# Patient Record
Sex: Male | Born: 1995 | Race: White | Hispanic: No | Marital: Single | Smoking: Current every day smoker
Health system: Southern US, Community
[De-identification: ages and names within clinical notes are randomized; demographics above are authoritative.]

## PROBLEM LIST (undated history)

## (undated) DIAGNOSIS — E079 Disorder of thyroid, unspecified: Secondary | ICD-10-CM

---
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
## 2017-02-20 DIAGNOSIS — Z720 Tobacco use: Secondary | ICD-10-CM | POA: Diagnosis not present

## 2017-02-20 DIAGNOSIS — Z0000 Encounter for general adult medical examination without abnormal findings: Secondary | ICD-10-CM | POA: Diagnosis not present

## 2017-09-18 DIAGNOSIS — A084 Viral intestinal infection, unspecified: Secondary | ICD-10-CM | POA: Diagnosis not present

## 2017-09-20 DIAGNOSIS — R1031 Right lower quadrant pain: Secondary | ICD-10-CM | POA: Diagnosis not present

## 2019-01-05 ENCOUNTER — Emergency Department
Admission: EM | Admit: 2019-01-05 | Discharge: 2019-01-05 | Disposition: A | Discharge status: Home / Self Care | Payer: Self-pay | Attending: Student in an Organized Health Care Education/Training Program | Admitting: Student in an Organized Health Care Education/Training Program

## 2019-01-05 ENCOUNTER — Encounter: Payer: Self-pay | Admitting: *Deleted

## 2019-01-05 ENCOUNTER — Other Ambulatory Visit: Payer: Self-pay

## 2019-01-05 DIAGNOSIS — S51811A Laceration without foreign body of right forearm, initial encounter: Secondary | ICD-10-CM | POA: Insufficient documentation

## 2019-01-05 DIAGNOSIS — Y939 Activity, unspecified: Secondary | ICD-10-CM | POA: Insufficient documentation

## 2019-01-05 DIAGNOSIS — Y929 Unspecified place or not applicable: Secondary | ICD-10-CM | POA: Insufficient documentation

## 2019-01-05 DIAGNOSIS — S41111A Laceration without foreign body of right upper arm, initial encounter: Secondary | ICD-10-CM

## 2019-01-05 DIAGNOSIS — S60511A Abrasion of right hand, initial encounter: Secondary | ICD-10-CM | POA: Insufficient documentation

## 2019-01-05 DIAGNOSIS — F17200 Nicotine dependence, unspecified, uncomplicated: Secondary | ICD-10-CM | POA: Insufficient documentation

## 2019-01-05 DIAGNOSIS — W25XXXA Contact with sharp glass, initial encounter: Secondary | ICD-10-CM | POA: Insufficient documentation

## 2019-01-05 DIAGNOSIS — Y999 Unspecified external cause status: Secondary | ICD-10-CM | POA: Insufficient documentation
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
## 2019-01-05 NOTE — ED Notes (Signed)
After being placed in  Room  Washed off arms  States he was fine did not want to be seen  Provider aware

## 2019-01-05 NOTE — ED Provider Notes (Signed)
[facility] Emergency Department Provider Note  ____________________________________________  Time seen: Approximately 7:06 PM  I have reviewed the triage vital signs and the nursing notes.   HISTORY  Chief Complaint Laceration    HPI Ginger Organoah Blower is a 23 y.o. male that presented to the emergency department to be evaluated for forearm lacerations from glass.  Patient was in the room and washing off lacerations and decided he did not want to be evaluated for lacerations any longer since they were smaller than he initially thought.  Last tetanus shot was within the last year.   No past medical history on file.  There are no active problems to display for this patient.   Prior to Admission medications   Not on File    Allergies Patient has no known allergies.  No family history on file.  Social History Social History   Tobacco Use  . Smoking status: Current Every Day Smoker  . Smokeless tobacco: Never Used  Substance Use Topics  . Alcohol use: Not Currently  . Drug use: Never     Review of Systems  Musculoskeletal: Negative for musculoskeletal pain. Skin: Negative for rash, ecchymosis.  Positive for lacerations.   ____________________________________________   PHYSICAL EXAM:  VITAL SIGNS: ED Triage Vitals  Enc Vitals Group     BP 01/05/19 1841 (!) 130/59     Pulse Rate 01/05/19 1841 79     Resp 01/05/19 1841 18     Temp 01/05/19 1841 99.5 F (37.5 C)     Temp Source 01/05/19 1841 Oral     SpO2 01/05/19 1841 99 %     Weight --      Height --      Head Circumference --      Peak Flow --      Pain Score 01/05/19 1839 0     Pain Loc --      Pain Edu? --      Excl. in GC? --      Constitutional: Alert and oriented. Well appearing and in no acute distress. Eyes: Conjunctivae are normal. PERRL. EOMI. Head: Atraumatic. ENT:      Ears:      Nose:       Mouth/Throat: Mucous membranes are moist.  Neck: No stridor.   Cardiovascular: Good peripheral circulation. Respiratory: Normal respiratory effort without tachypnea or retractions. Musculoskeletal: Full range of motion to all extremities. No gross deformities appreciated. Neurologic:  Normal speech and language. No gross focal neurologic deficits are appreciated.  Skin:  Skin is warm, dry and intact. No rash noted. Psychiatric: Mood and affect are normal. Speech and behavior are normal. Patient exhibits appropriate insight and judgement.   ____________________________________________   LABS (all labs ordered are listed, but only abnormal results are displayed)  Labs Reviewed - No data to display ____________________________________________  EKG   ____________________________________________  RADIOLOGY  No results found.  ____________________________________________    PROCEDURES  Procedure(s) performed:    Procedures    Medications - No data to display   ____________________________________________   INITIAL IMPRESSION / ASSESSMENT AND PLAN / ED COURSE  Pertinent labs & imaging results that were available during my care of the patient were reviewed by me and considered in my medical decision making (see chart for details).   Patient presents emergency department to be evaluated for lacerations.  Patient decided after being placed in ED room that lacerations were not as bad as he initially thought and did not want to be evaluated  for them anymore.  I viewed lacerations prior to patient leaving room.  He has a small laceration to forearm and an abrasion to his hand.  He will apply Neosporin and keep laceration covered.  Tetanus shot is up-to-date.    Halford Goetzke was evaluated in Emergency Department on 01/05/2019 for the symptoms described in the history of present illness. He was evaluated in the context of the global COVID-19 pandemic, which necessitated consideration that the patient might be at risk for infection with the  SARS-CoV-2 virus that causes COVID-19. Institutional protocols and algorithms that pertain to the evaluation of patients at risk for COVID-19 are in a state of rapid change based on information released by regulatory bodies including the CDC and federal and state organizations. These policies and algorithms were followed during the patient's care in the ED.  ____________________________________________  FINAL CLINICAL IMPRESSION(S) / ED DIAGNOSES  Final diagnoses:  Laceration of right upper extremity, initial encounter      NEW MEDICATIONS STARTED DURING THIS VISIT:  ED Discharge Orders    None          This chart was dictated using voice recognition software/Dragon. Despite best efforts to proofread, errors can occur which can change the meaning. Any change was purely unintentional.    Laban Emperor, PA-C 01/05/19 1913    Delman Kitten, MD 01/05/19 2125

## 2019-01-21 ENCOUNTER — Ambulatory Visit: Payer: Self-pay | Admitting: Licensed Clinical Social Worker

## 2021-01-26 ENCOUNTER — Emergency Department: Admission: EM | Admit: 2021-01-26 | Discharge: 2021-01-26 | Discharge status: Against Medical Advice | Payer: Self-pay

## 2021-01-26 ENCOUNTER — Ambulatory Visit: Payer: Self-pay

## 2021-01-26 NOTE — ED Notes (Signed)
Pt called in waiting area with no answer.

## 2021-02-01 NOTE — Progress Notes (Signed)
 Subjective:  Patient ID: Andre Strong is a 25 y.o. male.  HPI  Exposure To Illness Exposure to illness:  COVID 19 Is patient symptomatic?: Yes   Presenting/Associated symptoms: rhinorrhea and congestion   Severity:  Mild Onset quality:  Gradual Duration of current symptoms:  1 week Progression:  Gradually improving Chronicity:  New Relieved by:  OTC analgesics and OTC cough/cold medications Risk factors: known exposure to illness or sick contacts    Patient has not tested positive for COVID within the last week but has experienced symptoms and has had exposure through significant other        Review of Systems  Constitutional: Negative.   HENT: Positive for congestion and rhinorrhea.   Respiratory: Negative.   Cardiovascular: Negative.   Gastrointestinal: Negative.   Musculoskeletal: Negative.   Skin: Negative.   Neurological: Negative.   All other systems reviewed and are negative.   Social History   Tobacco Use  Smoking Status Not on file  Smokeless Tobacco Not on file   History reviewed. No pertinent past medical history. History reviewed. No pertinent surgical history. History reviewed. No pertinent family history. Objective:      Physical Exam Constitutional:      Appearance: Normal appearance. He is not ill-appearing or toxic-appearing.  HENT:     Head: Normocephalic and atraumatic.     Nose: Nose normal.     Mouth/Throat:     Mouth: Mucous membranes are moist.     Pharynx: Oropharynx is clear.  Eyes:     Conjunctiva/sclera: Conjunctivae normal.  Cardiovascular:     Rate and Rhythm: Normal rate and regular rhythm.     Pulses: Normal pulses.     Heart sounds: Normal heart sounds.  Pulmonary:     Effort: Pulmonary effort is normal.     Breath sounds: Normal breath sounds.  Abdominal:     Tenderness: There is no abdominal tenderness.  Musculoskeletal:        General: Normal range of motion.     Cervical back: Normal range of motion.  Skin:     General: Skin is warm and dry.     Capillary Refill: Capillary refill takes less than 2 seconds.  Neurological:     General: No focal deficit present.     Mental Status: He is alert and oriented to person, place, and time.  Psychiatric:        Mood and Affect: Mood normal.        Behavior: Behavior normal.       Assessment/Plan:    Based on today's history, physical exam and review of all relevant testing completed in clinic today  patient's visit diagnosis is/includes  1. Contact with and (suspected) exposure to covid-19   2. 2019 novel coronavirus detected    Patient does not have a history of chronic conditions identified today.  Treatment plan includes:  Orders Placed: Orders Placed This Encounter  Procedures  . LumiraDX SARS-COV-2 Rapid Result Antigen Test   Medications ordered this visit    No prescriptions requested or ordered in this encounter    Provider Recommendations    Follow up care instructions were provided to and reviewed with the Patient. All questions were answered and patient verbalized understanding of plan of care today.   Patient has tested positive for COVID 19 today.  Work/school noted will be provided, following CDC protocol for a 5 day quarantine period. Patient advised to seek emergency care if the following symptoms develop or persist:  -Chest  pain -Shortness of breath  -Fever unresponsive to treatment  -Persistent nausea/vomiting and or diarrhea for greater than 72 hours -Dizziness, confusion, syncope or near syncope   Advised to take Tylenol or Ibuprofen (if applicable, no allergies) for fever, myalgias, headache, etc.   Patient verbalizes understanding regarding red flags and proper follow-up.  Continue to wear a mask and practice proper hand hygiene.  Ensure adequate food and fluid intake and treat symptoms accordingly.                                             COVID Oral Antiviral Eligibility  Status: Patient declines: Due to not interersted

## 2024-04-29 ENCOUNTER — Emergency Department
Admission: EM | Admit: 2024-04-29 | Discharge: 2024-04-29 | Disposition: A | Discharge status: Home / Self Care | Payer: Self-pay | Attending: Emergency Medicine | Admitting: Emergency Medicine

## 2024-04-29 ENCOUNTER — Other Ambulatory Visit: Payer: Self-pay

## 2024-04-29 DIAGNOSIS — D72829 Elevated white blood cell count, unspecified: Secondary | ICD-10-CM | POA: Insufficient documentation

## 2024-04-29 DIAGNOSIS — R197 Diarrhea, unspecified: Secondary | ICD-10-CM | POA: Diagnosis not present

## 2024-04-29 DIAGNOSIS — R509 Fever, unspecified: Secondary | ICD-10-CM | POA: Insufficient documentation

## 2024-04-29 DIAGNOSIS — M791 Myalgia, unspecified site: Secondary | ICD-10-CM | POA: Insufficient documentation

## 2024-04-29 DIAGNOSIS — J111 Influenza due to unidentified influenza virus with other respiratory manifestations: Secondary | ICD-10-CM

## 2024-04-29 DIAGNOSIS — J029 Acute pharyngitis, unspecified: Secondary | ICD-10-CM | POA: Diagnosis not present

## 2024-04-29 DIAGNOSIS — R634 Abnormal weight loss: Secondary | ICD-10-CM | POA: Insufficient documentation

## 2024-04-29 LAB — URINALYSIS, ROUTINE W REFLEX MICROSCOPIC
Bilirubin Urine: NEGATIVE
Glucose, UA: NEGATIVE mg/dL
Hgb urine dipstick: NEGATIVE
Ketones, ur: 5 mg/dL — AB
Leukocytes,Ua: NEGATIVE
Nitrite: NEGATIVE
Protein, ur: NEGATIVE mg/dL
Specific Gravity, Urine: 1.021 (ref 1.005–1.030)
pH: 5 (ref 5.0–8.0)

## 2024-04-29 LAB — COMPREHENSIVE METABOLIC PANEL WITH GFR
ALT: 33 U/L (ref 0–44)
AST: 38 U/L (ref 15–41)
Albumin: 3.9 g/dL (ref 3.5–5.0)
Alkaline Phosphatase: 123 U/L (ref 38–126)
Anion gap: 11 (ref 5–15)
BUN: 11 mg/dL (ref 6–20)
CO2: 25 mmol/L (ref 22–32)
Calcium: 9.1 mg/dL (ref 8.9–10.3)
Chloride: 100 mmol/L (ref 98–111)
Creatinine, Ser: 0.63 mg/dL (ref 0.61–1.24)
GFR, Estimated: >60 mL/min (ref >60)
Glucose, Bld: 162 mg/dL — ABNORMAL HIGH (ref 70–99)
Potassium: 3.5 mmol/L (ref 3.5–5.1)
Sodium: 136 mmol/L (ref 135–145)
Total Bilirubin: 1.3 mg/dL — ABNORMAL HIGH (ref 0.0–1.2)
Total Protein: 7.3 g/dL (ref 6.5–8.1)

## 2024-04-29 LAB — URINE DRUG SCREEN, QUALITATIVE (ARMC ONLY)
Amphetamines, Ur Screen: NOT DETECTED
Barbiturates, Ur Screen: NOT DETECTED
Benzodiazepine, Ur Scrn: NOT DETECTED
Cannabinoid 50 Ng, Ur ~~LOC~~: POSITIVE — AB
Cocaine Metabolite,Ur ~~LOC~~: NOT DETECTED
MDMA (Ecstasy)Ur Screen: NOT DETECTED
Methadone Scn, Ur: NOT DETECTED
Opiate, Ur Screen: NOT DETECTED
Phencyclidine (PCP) Ur S: NOT DETECTED
Tricyclic, Ur Screen: NOT DETECTED

## 2024-04-29 LAB — CBC
HCT: 41.3 % (ref 39.0–52.0)
Hemoglobin: 14.6 g/dL (ref 13.0–17.0)
MCH: 28.1 pg (ref 26.0–34.0)
MCHC: 35.4 g/dL (ref 30.0–36.0)
MCV: 79.6 fL — ABNORMAL LOW (ref 80.0–100.0)
Platelets: 187 K/uL (ref 150–400)
RBC: 5.19 MIL/uL (ref 4.22–5.81)
RDW: 13.7 % (ref 11.5–15.5)
WBC: 15.4 K/uL — ABNORMAL HIGH (ref 4.0–10.5)
nRBC: 0 % (ref 0.0–0.2)

## 2024-04-29 LAB — LIPASE, BLOOD: Lipase: 18 U/L (ref 11–51)

## 2024-04-29 NOTE — Discharge Instructions (Addendum)
 Your testing today was fortunately overall reassuring, but your history of unintentional weight loss is concerning.  Is very importantly follow-up closely as an outpatient.  I placed a referral to a primary care doctor, but also included information for several in the area.  I have also included information for follow-up with GI.  Return to the ER for any new or worsening symptoms.

## 2024-04-29 NOTE — ED Provider Notes (Signed)
 [facility] Provider Note    Event Date/Time   First MD Initiated Contact with Patient 04/29/24 2101     (approximate)   History   Generalized Body Aches and Weight Loss   HPI  Andre Strong is a 28 year old male presenting to the emergency department for evaluation of fever and bodyaches.  Patient reports that over the past several months he has had frequent episodes of viral syndromes including upper respiratory symptoms as well as GI illness.  A few days ago he had a fever.  Today he had a sore throat and went to urgent care where he had negative COVID, flu, strep testing.  While there he mentioned that he has had a 60 pound weight loss over the past 5 months largely unintentionally.  Does report that he switch to diet soda during this time.  Does report frequent diarrhea during this time.  In the setting of this he  was directed to the ER his primary care doctor for follow-up.  Does not currently have a PCP so he presented to the ER.    Physical Exam   Triage Vital Signs: ED Triage Vitals  Encounter Vitals Group     BP 04/29/24 1913 139/73     Girls Systolic BP Percentile --      Girls Diastolic BP Percentile --      Boys Systolic BP Percentile --      Boys Diastolic BP Percentile --      Pulse Rate 04/29/24 1913 (!) 109     Resp 04/29/24 1913 19     Temp 04/29/24 1913 98.1 F (36.7 C)     Temp src --      SpO2 04/29/24 1913 97 %     Weight 04/29/24 1911 175 lb (79.4 kg)     Height 04/29/24 1911 5' 9 (1.753 m)     Head Circumference --      Peak Flow --      Pain Score 04/29/24 1910 3     Pain Loc --      Pain Education --      Exclude from Growth Chart --     Most recent vital signs: Vitals:   04/29/24 1913  BP: 139/73  Pulse: (!) 109  Resp: 19  Temp: 98.1 F (36.7 C)  SpO2: 97%     General: Awake, interactive  CV:  Good peripheral perfusion Resp:  Unlabored respirations, lungs clear to auscultation Abd:  Nondistended,  soft, nontender Neuro:  Symmetric facial movement, fluid speech   ED Results / Procedures / Treatments   Labs (all labs ordered are listed, but only abnormal results are displayed) Labs Reviewed  COMPREHENSIVE METABOLIC PANEL WITH GFR - Abnormal; Notable for the following components:      Result Value   Glucose, Bld 162 (*)    Total Bilirubin 1.3 (*)    All other components within normal limits  CBC - Abnormal; Notable for the following components:   WBC 15.4 (*)    MCV 79.6 (*)    All other components within normal limits  URINALYSIS, ROUTINE W REFLEX MICROSCOPIC - Abnormal; Notable for the following components:   Color, Urine AMBER (*)    APPearance CLEAR (*)    Ketones, ur 5 (*)    All other components within normal limits  URINE DRUG SCREEN, QUALITATIVE ([facility] ONLY) - Abnormal; Notable for the following components:   Cannabinoid 50 Ng, Ur [place] POSITIVE (*)    All  other components within normal limits  LIPASE, BLOOD     EKG EKG independently reviewed and interpreted by myself demonstrates:    RADIOLOGY Imaging independently reviewed and interpreted by myself demonstrates:   Formal Radiology Read:  No results found.  PROCEDURES:  Critical Care performed: No  Procedures   MEDICATIONS ORDERED IN ED: Medications - No data to display   IMPRESSION / MDM / ASSESSMENT AND PLAN / ED COURSE  I reviewed the triage vital signs and the nursing notes.  Differential diagnosis includes, but is not limited to, viral illness, anemia, electrolyte abnormality, weight loss due to dietary intolerances, cannot rule out malignancy   Patient's presentation is most consistent with acute presentation with potential threat to life or bodily function.  28 year old male presenting to the emergency department for evaluation of fever and bodyaches.  Afebrile here with mild tachycardia, stable blood pressure.  CBC does have leukocytosis with WBC 15.4.  CMP without significant derangement.   Specifically without significant electrolyte derangement, normal albumin.  UA without evidence of infection.  Patient's acute presentation seems most consistent with a viral illness.  His progressive weight loss certainly is concerning, but his abdominal exam here is overall reassuring.  Low suspicion emergent process.  I discussed that he certainly needs close outpatient follow-up for further evaluation.  I will place a PCP referral and give him information for follow-up with GI.  We discussed that with his longstanding diarrhea, may have primary GI process that cannot fully be evaluated for here in the emergency department.  Do think CT would be of limited acute utility.  Patient is comfortable with discharge home and outpatient follow-up.  Strict return precautions provided.  Patient discharged in stable condition.      FINAL CLINICAL IMPRESSION(S) / ED DIAGNOSES   Final diagnoses:  Influenza-like illness  Weight loss, unintentional     Rx / DC Orders   ED Discharge Orders          Ordered    Ambulatory Referral to Primary Care (Establish Care)       Comments: Unintentional weight loss, need GI and malignancy workup   04/29/24 2138             Note:  This document was prepared using Dragon voice recognition software and may include unintentional dictation errors.   Levander Slate, MD 04/29/24 2139

## 2024-04-29 NOTE — ED Notes (Signed)
 Pt states for the past 2 months he has been having cold like symptoms such as body aches/stiffness,N/V/D with a significant amount of weight loss.  States he has noticed bile in both his vomit and feces.  States he went to the UC and they stated that he should be tested for cancer.

## 2024-04-29 NOTE — ED Triage Notes (Addendum)
 Pt reports intermittent vomiting and a loss of 70lbs over the past 5 months aprox. Pt was seen at [facility] and told to come here to be tested for cancer Pt reports generalized body aches and muscle spasms as well. Pt was tested for COVID flu and strep at [facility] and was all negative.

## 2024-05-11 ENCOUNTER — Other Ambulatory Visit: Payer: Self-pay | Admitting: Gastroenterology

## 2024-05-11 DIAGNOSIS — R195 Other fecal abnormalities: Secondary | ICD-10-CM

## 2024-05-11 DIAGNOSIS — R109 Unspecified abdominal pain: Secondary | ICD-10-CM

## 2024-05-11 DIAGNOSIS — R634 Abnormal weight loss: Secondary | ICD-10-CM

## 2024-05-15 ENCOUNTER — Ambulatory Visit
Admission: RE | Admit: 2024-05-15 | Discharge: 2024-05-15 | Disposition: A | Discharge status: Home / Self Care | Source: Ambulatory Visit | Attending: Gastroenterology | Admitting: Gastroenterology

## 2024-05-15 DIAGNOSIS — R634 Abnormal weight loss: Secondary | ICD-10-CM | POA: Insufficient documentation

## 2024-05-15 DIAGNOSIS — R195 Other fecal abnormalities: Secondary | ICD-10-CM | POA: Insufficient documentation

## 2024-05-15 DIAGNOSIS — R109 Unspecified abdominal pain: Secondary | ICD-10-CM | POA: Insufficient documentation

## 2024-05-15 MED ORDER — IOHEXOL 300 MG/ML  SOLN
100.0000 mL | Freq: Once | INTRAMUSCULAR | Status: AC | PRN
  Administered 2024-05-15: 100 mL via INTRAVENOUS

## 2024-05-29 ENCOUNTER — Other Ambulatory Visit: Payer: Self-pay

## 2024-05-29 DIAGNOSIS — R1031 Right lower quadrant pain: Secondary | ICD-10-CM | POA: Diagnosis present

## 2024-05-29 DIAGNOSIS — Z5321 Procedure and treatment not carried out due to patient leaving prior to being seen by health care provider: Secondary | ICD-10-CM | POA: Diagnosis not present

## 2024-05-29 LAB — URINALYSIS, ROUTINE W REFLEX MICROSCOPIC
Bilirubin Urine: NEGATIVE
Glucose, UA: 50 mg/dL — AB
Hgb urine dipstick: NEGATIVE
Ketones, ur: NEGATIVE mg/dL
Leukocytes,Ua: NEGATIVE
Nitrite: NEGATIVE
Protein, ur: NEGATIVE mg/dL
Specific Gravity, Urine: 1.017 (ref 1.005–1.030)
pH: 7 (ref 5.0–8.0)

## 2024-05-29 LAB — COMPREHENSIVE METABOLIC PANEL WITH GFR
ALT: 26 U/L (ref 0–44)
AST: 32 U/L (ref 15–41)
Albumin: 4 g/dL (ref 3.5–5.0)
Alkaline Phosphatase: 171 U/L — ABNORMAL HIGH (ref 38–126)
Anion gap: 12 (ref 5–15)
BUN: 14 mg/dL (ref 6–20)
CO2: 25 mmol/L (ref 22–32)
Calcium: 9.3 mg/dL (ref 8.9–10.3)
Chloride: 102 mmol/L (ref 98–111)
Creatinine, Ser: 0.65 mg/dL (ref 0.61–1.24)
GFR, Estimated: >60 mL/min (ref >60)
Glucose, Bld: 116 mg/dL — ABNORMAL HIGH (ref 70–99)
Potassium: 3.8 mmol/L (ref 3.5–5.1)
Sodium: 139 mmol/L (ref 135–145)
Total Bilirubin: 1 mg/dL (ref 0.0–1.2)
Total Protein: 7.8 g/dL (ref 6.5–8.1)

## 2024-05-29 LAB — CBC
HCT: 44.7 % (ref 39.0–52.0)
Hemoglobin: 15 g/dL (ref 13.0–17.0)
MCH: 27.6 pg (ref 26.0–34.0)
MCHC: 33.6 g/dL (ref 30.0–36.0)
MCV: 82.3 fL (ref 80.0–100.0)
Platelets: 222 K/uL (ref 150–400)
RBC: 5.43 MIL/uL (ref 4.22–5.81)
RDW: 14.4 % (ref 11.5–15.5)
WBC: 7.9 K/uL (ref 4.0–10.5)
nRBC: 0 % (ref 0.0–0.2)

## 2024-05-29 LAB — LIPASE, BLOOD: Lipase: 31 U/L (ref 11–51)

## 2024-05-29 NOTE — ED Triage Notes (Signed)
 Patient ambulatory to triage with complaints of RLQ abdominal pain. He stated initially it started around his belly button around noon today, then the pain moved. Endorses vomiting on the way here. Endorses having 2 Bms today, states he normally goes 6-10 times so feels he may also be constipated or have appendicitis.

## 2024-05-30 ENCOUNTER — Emergency Department
Admission: EM | Admit: 2024-05-30 | Discharge: 2024-05-30 | Discharge status: Against Medical Advice | Attending: Emergency Medicine | Admitting: Emergency Medicine

## 2024-05-30 NOTE — ED Notes (Signed)
 Patient approached NF desk at this time stating he could not wait any longer. Exited ER at the time in no acute distress with steady even gait.

## 2024-06-02 ENCOUNTER — Ambulatory Visit: Payer: Self-pay | Admitting: Psychiatry

## 2024-06-25 ENCOUNTER — Emergency Department
Admission: EM | Admit: 2024-06-25 | Discharge: 2024-06-25 | Disposition: A | Discharge status: Home / Self Care | Attending: Emergency Medicine | Admitting: Emergency Medicine

## 2024-06-25 ENCOUNTER — Other Ambulatory Visit: Payer: Self-pay

## 2024-06-25 DIAGNOSIS — R112 Nausea with vomiting, unspecified: Secondary | ICD-10-CM | POA: Insufficient documentation

## 2024-06-25 HISTORY — DX: Disorder of thyroid, unspecified: E07.9

## 2024-06-25 LAB — CBC
HCT: 47.3 % (ref 39.0–52.0)
Hemoglobin: 16.8 g/dL (ref 13.0–17.0)
MCH: 28.4 pg (ref 26.0–34.0)
MCHC: 35.5 g/dL (ref 30.0–36.0)
MCV: 79.9 fL — ABNORMAL LOW (ref 80.0–100.0)
Platelets: 231 K/uL (ref 150–400)
RBC: 5.92 MIL/uL — ABNORMAL HIGH (ref 4.22–5.81)
RDW: 13.1 % (ref 11.5–15.5)
WBC: 7.5 K/uL (ref 4.0–10.5)
nRBC: 0 % (ref 0.0–0.2)

## 2024-06-25 LAB — URINALYSIS, ROUTINE W REFLEX MICROSCOPIC
Bilirubin Urine: NEGATIVE
Glucose, UA: NEGATIVE mg/dL
Hgb urine dipstick: NEGATIVE
Ketones, ur: NEGATIVE mg/dL
Leukocytes,Ua: NEGATIVE
Nitrite: NEGATIVE
Protein, ur: NEGATIVE mg/dL
Specific Gravity, Urine: 1.017 (ref 1.005–1.030)
pH: 8 (ref 5.0–8.0)

## 2024-06-25 MED ORDER — ONDANSETRON 4 MG PO TBDP: 4.0000 mg | ORAL_TABLET | Freq: Three times a day (TID) | ORAL | 0 refills | Status: AC | PRN

## 2024-06-25 NOTE — ED Triage Notes (Signed)
 Patient states vomiting x 2-3 days; denies abdominal pain and diarrhea.
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
## 2024-06-25 NOTE — ED Notes (Signed)
 Patient had soda in triage, politely asked patient not to drink anymore due to symptoms of vomiting.

## 2024-06-30 ENCOUNTER — Other Ambulatory Visit: Payer: Self-pay | Admitting: Surgery

## 2024-07-01 MED ORDER — CEFAZOLIN SODIUM-DEXTROSE 2-4 GM/100ML-% IV SOLN
2.0000 g | INTRAVENOUS | Status: AC
  Administered 2024-07-02: 2 g via INTRAVENOUS

## 2024-07-02 ENCOUNTER — Ambulatory Visit
Admission: RE | Admit: 2024-07-02 | Discharge: 2024-07-02 | Disposition: A | Discharge status: Home / Self Care | Attending: Surgery | Admitting: Surgery

## 2024-07-02 ENCOUNTER — Other Ambulatory Visit: Payer: Self-pay

## 2024-07-02 ENCOUNTER — Encounter: Admission: RE | Disposition: A | Payer: Self-pay | Source: Home / Self Care | Attending: Surgery

## 2024-07-02 ENCOUNTER — Ambulatory Visit

## 2024-07-02 ENCOUNTER — Encounter: Payer: Self-pay | Admitting: Surgery

## 2024-07-02 DIAGNOSIS — Z79899 Other long term (current) drug therapy: Secondary | ICD-10-CM | POA: Diagnosis not present

## 2024-07-02 DIAGNOSIS — E0500 Thyrotoxicosis with diffuse goiter without thyrotoxic crisis or storm: Secondary | ICD-10-CM | POA: Diagnosis not present

## 2024-07-02 DIAGNOSIS — K219 Gastro-esophageal reflux disease without esophagitis: Secondary | ICD-10-CM | POA: Diagnosis not present

## 2024-07-02 DIAGNOSIS — F17200 Nicotine dependence, unspecified, uncomplicated: Secondary | ICD-10-CM | POA: Diagnosis not present

## 2024-07-02 DIAGNOSIS — S42002A Fracture of unspecified part of left clavicle, initial encounter for closed fracture: Secondary | ICD-10-CM

## 2024-07-02 DIAGNOSIS — S42022A Displaced fracture of shaft of left clavicle, initial encounter for closed fracture: Secondary | ICD-10-CM | POA: Diagnosis present

## 2024-07-02 DIAGNOSIS — S8010XA Contusion of unspecified lower leg, initial encounter: Secondary | ICD-10-CM | POA: Diagnosis not present

## 2024-07-02 HISTORY — PX: ORIF CLAVICULAR FRACTURE: SHX5055

## 2024-07-02 SURGERY — OPEN REDUCTION INTERNAL FIXATION (ORIF) CLAVICULAR FRACTURE
Anesthesia: General | Site: Shoulder | Laterality: Left

## 2024-07-02 MED ORDER — ACETAMINOPHEN 325 MG PO TABS: 325.0000 mg | ORAL_TABLET | Freq: Four times a day (QID) | ORAL | Status: DC | PRN

## 2024-07-02 MED ORDER — SODIUM CHLORIDE 0.9 % IV SOLN: INTRAVENOUS | Status: DC

## 2024-07-02 MED ORDER — BUPIVACAINE LIPOSOME 1.3 % IJ SUSP
INTRAMUSCULAR | Status: AC
  Filled 2024-07-02: qty 20

## 2024-07-02 MED ORDER — 0.9 % SODIUM CHLORIDE (POUR BTL) OPTIME
TOPICAL | Status: DC | PRN
  Administered 2024-07-02: 500 mL

## 2024-07-02 MED ORDER — METOCLOPRAMIDE HCL 10 MG PO TABS: 5.0000 mg | ORAL_TABLET | Freq: Three times a day (TID) | ORAL | Status: DC | PRN

## 2024-07-02 MED ORDER — ROCURONIUM BROMIDE 10 MG/ML (PF) SYRINGE
PREFILLED_SYRINGE | INTRAVENOUS | Status: AC
  Filled 2024-07-02: qty 10

## 2024-07-02 MED ORDER — ROCURONIUM BROMIDE 100 MG/10ML IV SOLN
INTRAVENOUS | Status: DC | PRN
  Administered 2024-07-02: 20 mg via INTRAVENOUS
  Administered 2024-07-02: 10 mg via INTRAVENOUS
  Administered 2024-07-02: 50 mg via INTRAVENOUS
  Administered 2024-07-02: 10 mg via INTRAVENOUS

## 2024-07-02 MED ORDER — FENTANYL CITRATE (PF) 100 MCG/2ML IJ SOLN
INTRAMUSCULAR | Status: AC
  Filled 2024-07-02: qty 2

## 2024-07-02 MED ORDER — ACETAMINOPHEN 10 MG/ML IV SOLN
INTRAVENOUS | Status: AC
  Filled 2024-07-02: qty 100

## 2024-07-02 MED ORDER — MIDAZOLAM HCL 2 MG/2ML IJ SOLN
INTRAMUSCULAR | Status: AC
  Filled 2024-07-02: qty 2

## 2024-07-02 MED ORDER — ONDANSETRON HCL 4 MG PO TABS: 4.0000 mg | ORAL_TABLET | Freq: Four times a day (QID) | ORAL | Status: DC | PRN

## 2024-07-02 MED ORDER — MIDAZOLAM HCL (PF) 2 MG/2ML IJ SOLN
INTRAMUSCULAR | Status: DC | PRN
  Administered 2024-07-02: 2 mg via INTRAVENOUS

## 2024-07-02 MED ORDER — CHLORHEXIDINE GLUCONATE 0.12 % MT SOLN
OROMUCOSAL | Status: AC
  Filled 2024-07-02: qty 15

## 2024-07-02 MED ORDER — BUPIVACAINE-EPINEPHRINE (PF) 0.5% -1:200000 IJ SOLN
INTRAMUSCULAR | Status: AC
  Filled 2024-07-02: qty 10

## 2024-07-02 MED ORDER — SUGAMMADEX SODIUM 200 MG/2ML IV SOLN
INTRAVENOUS | Status: DC | PRN
  Administered 2024-07-02: 200 mg via INTRAVENOUS

## 2024-07-02 MED ORDER — DEXAMETHASONE SOD PHOSPHATE PF 10 MG/ML IJ SOLN
INTRAMUSCULAR | Status: DC | PRN
  Administered 2024-07-02: 8 mg via INTRAVENOUS

## 2024-07-02 MED ORDER — OXYCODONE HCL 5 MG PO TABS
ORAL_TABLET | ORAL | Status: AC
  Filled 2024-07-02: qty 1

## 2024-07-02 MED ORDER — LACTATED RINGERS IV SOLN: INTRAVENOUS | Status: DC

## 2024-07-02 MED ORDER — DROPERIDOL 2.5 MG/ML IJ SOLN: 0.6250 mg | Freq: Once | INTRAMUSCULAR | Status: DC | PRN

## 2024-07-02 MED ORDER — PHENYLEPHRINE HCL-NACL 20-0.9 MG/250ML-% IV SOLN
INTRAVENOUS | Status: AC
  Filled 2024-07-02: qty 250

## 2024-07-02 MED ORDER — ORAL CARE MOUTH RINSE: 15.0000 mL | Freq: Once | OROMUCOSAL | Status: AC

## 2024-07-02 MED ORDER — KETOROLAC TROMETHAMINE 30 MG/ML IJ SOLN
30.0000 mg | Freq: Once | INTRAMUSCULAR | Status: AC
  Administered 2024-07-02: 30 mg via INTRAVENOUS

## 2024-07-02 MED ORDER — OXYCODONE HCL 5 MG PO TABS
5.0000 mg | ORAL_TABLET | ORAL | Status: DC | PRN
  Administered 2024-07-02: 5 mg via ORAL

## 2024-07-02 MED ORDER — KETOROLAC TROMETHAMINE 30 MG/ML IJ SOLN
INTRAMUSCULAR | Status: AC
  Filled 2024-07-02: qty 1

## 2024-07-02 MED ORDER — CHLORHEXIDINE GLUCONATE 0.12 % MT SOLN
15.0000 mL | Freq: Once | OROMUCOSAL | Status: AC
  Administered 2024-07-02: 15 mL via OROMUCOSAL

## 2024-07-02 MED ORDER — METOCLOPRAMIDE HCL 5 MG/ML IJ SOLN: 5.0000 mg | Freq: Three times a day (TID) | INTRAMUSCULAR | Status: DC | PRN

## 2024-07-02 MED ORDER — ONDANSETRON HCL 4 MG/2ML IJ SOLN: 4.0000 mg | Freq: Four times a day (QID) | INTRAMUSCULAR | Status: DC | PRN

## 2024-07-02 MED ORDER — FENTANYL CITRATE (PF) 100 MCG/2ML IJ SOLN: 25.0000 ug | INTRAMUSCULAR | Status: DC | PRN

## 2024-07-02 MED ORDER — OXYCODONE HCL 5 MG PO TABS: 5.0000 mg | ORAL_TABLET | ORAL | 0 refills | Status: AC | PRN

## 2024-07-02 MED ORDER — FENTANYL CITRATE (PF) 100 MCG/2ML IJ SOLN
INTRAMUSCULAR | Status: DC | PRN
  Administered 2024-07-02 (×5): 50 ug via INTRAVENOUS

## 2024-07-02 MED ORDER — BUPIVACAINE-EPINEPHRINE (PF) 0.5% -1:200000 IJ SOLN
INTRAMUSCULAR | Status: DC | PRN
  Administered 2024-07-02: 10 mL

## 2024-07-02 MED ORDER — BUPIVACAINE LIPOSOME 1.3 % IJ SUSP
INTRAMUSCULAR | Status: DC | PRN
  Administered 2024-07-02: 20 mL

## 2024-07-02 MED ORDER — ONDANSETRON HCL 4 MG/2ML IJ SOLN
INTRAMUSCULAR | Status: DC | PRN
  Administered 2024-07-02: 4 mg via INTRAVENOUS

## 2024-07-02 MED ORDER — PROPOFOL 10 MG/ML IV BOLUS
INTRAVENOUS | Status: DC | PRN
  Administered 2024-07-02: 200 mg via INTRAVENOUS

## 2024-07-02 MED ORDER — DEXMEDETOMIDINE HCL IN NACL 80 MCG/20ML IV SOLN
INTRAVENOUS | Status: DC | PRN
  Administered 2024-07-02: 4 ug via INTRAVENOUS
  Administered 2024-07-02 (×2): 8 ug via INTRAVENOUS

## 2024-07-02 MED ORDER — LIDOCAINE HCL (CARDIAC) PF 100 MG/5ML IV SOSY
PREFILLED_SYRINGE | INTRAVENOUS | Status: DC | PRN
  Administered 2024-07-02: 80 mg via INTRAVENOUS

## 2024-07-02 MED ORDER — CEFAZOLIN SODIUM-DEXTROSE 2-4 GM/100ML-% IV SOLN
INTRAVENOUS | Status: AC
  Filled 2024-07-02: qty 100

## 2024-07-02 MED ORDER — ACETAMINOPHEN 10 MG/ML IV SOLN
INTRAVENOUS | Status: DC | PRN
  Administered 2024-07-02: 1000 mg via INTRAVENOUS

## 2024-07-02 SURGICAL SUPPLY — 41 items
BIT DRILL 2.8 QUICK RELEASE (BIT) IMPLANT
BIT DRILL 2.8X5 QR DISP (BIT) IMPLANT
BIT DRILL QUICK RELEASE 3.5MM (BIT) IMPLANT
BNDG COHESIVE 4X5 TAN STRL LF (GAUZE/BANDAGES/DRESSINGS) ×1 IMPLANT
CHLORAPREP W/TINT 26 (MISCELLANEOUS) ×2 IMPLANT
DRAPE C-ARM XRAY 36X54 (DRAPES) ×1 IMPLANT
DRAPE INCISE IOBAN 66X45 STRL (DRAPES) ×2 IMPLANT
DRSG OPSITE POSTOP 4X6 (GAUZE/BANDAGES/DRESSINGS) ×1 IMPLANT
ELECTRODE REM PT RTRN 9FT ADLT (ELECTROSURGICAL) ×1 IMPLANT
GAUZE SPONGE 4X4 12PLY STRL (GAUZE/BANDAGES/DRESSINGS) ×1 IMPLANT
GLOVE BIO SURGEON STRL SZ7.5 (GLOVE) ×2 IMPLANT
GLOVE BIO SURGEON STRL SZ8 (GLOVE) ×2 IMPLANT
GLOVE BIOGEL PI IND STRL 8 (GLOVE) ×3 IMPLANT
GOWN STRL REUS W/ TWL LRG LVL3 (GOWN DISPOSABLE) ×1 IMPLANT
GOWN STRL REUS W/ TWL XL LVL3 (GOWN DISPOSABLE) ×1 IMPLANT
IMMOBILIZER SHDR LG 32-36 (SOFTGOODS) ×1 IMPLANT
KIT STABILIZATION SHOULDER (MISCELLANEOUS) ×1 IMPLANT
KIT TURNOVER KIT A (KITS) ×1 IMPLANT
MANIFOLD NEPTUNE II (INSTRUMENTS) ×1 IMPLANT
MASK FACE SPIDER DISP (MASK) ×1 IMPLANT
MAT ABSORB FLUID 56X50 GRAY (MISCELLANEOUS) ×1 IMPLANT
NDL FILTER BLUNT 18X1 1/2 (NEEDLE) ×1 IMPLANT
NEEDLE FILTER BLUNT 18X1 1/2 (NEEDLE) ×1 IMPLANT
NS IRRIG 500ML POUR BTL (IV SOLUTION) ×1 IMPLANT
PACK ARTHROSCOPY SHOULDER (MISCELLANEOUS) ×1 IMPLANT
PLATE CLAVICLE LATERAL (Plate) IMPLANT
SCREW HEXALOBE NON-LOCK 3.5X14 (Screw) IMPLANT
SCREW HEXALOBE VA 3.5X10 (Screw) IMPLANT
SCREW LOCK 12X3.5X HEXALOBE (Screw) IMPLANT
SCREW LOCKING 3.5X10MM (Screw) IMPLANT
SCREW NONLOCK HEX 3.5X12 (Screw) IMPLANT
SPONGE T-LAP 18X18 ~~LOC~~+RFID (SPONGE) ×1 IMPLANT
STAPLER SKIN PROX 35W (STAPLE) ×1 IMPLANT
STRIP CLOSURE SKIN 1/2X4 (GAUZE/BANDAGES/DRESSINGS) ×1 IMPLANT
SUT VIC AB 2-0 CT1 (SUTURE) IMPLANT
SUT VIC AB 2-0 CT1 TAPERPNT 27 (SUTURE) ×2 IMPLANT
SUT VIC AB 2-0 CT2 27 (SUTURE) ×2 IMPLANT
SUT VIC AB 3-0 SH 27X BRD (SUTURE) IMPLANT
SYR 10ML LL (SYRINGE) ×1 IMPLANT
TRAP FLUID SMOKE EVACUATOR (MISCELLANEOUS) ×1 IMPLANT
left lateral clavicle plate 12 hole (Plate) IMPLANT

## 2024-07-02 NOTE — Transfer of Care (Signed)
 Immediate Anesthesia Transfer of Care Note  Patient: Andre Strong  Procedure(s) Performed: OPEN REDUCTION INTERNAL FIXATION (ORIF) CLAVICULAR FRACTURE (Left: Shoulder)  Patient Location: PACU  Anesthesia Type:General  Level of Consciousness: drowsy  Airway & Oxygen Therapy: Patient Spontanous Breathing and Patient connected to face mask oxygen  Post-op Assessment: Report given to RN and Post -op Vital signs reviewed and stable  Post vital signs: Reviewed and stable  Last Vitals:  Vitals Value Taken Time  BP 129/57   Temp    Pulse 86   Resp 14   SpO2 98     Last Pain:  Vitals:   07/02/24 1127  TempSrc: Temporal  PainSc: 6          Complications: No notable events documented.
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
## 2024-07-02 NOTE — Discharge Instructions (Addendum)
 Orthopedic discharge instructions: May shower with intact OpSite dressing then reapply shoulder immobilizer after drying off.  Apply ice frequently to shoulder. Take ibuprofen 600-800 mg TID with meals for 3-5 days, then as necessary. Take oxycodone as prescribed when needed.  May supplement with ES Tylenol if necessary. Keep shoulder immobilizer on at all times except may remove for bathing purposes. Follow-up in 10-14 days or as scheduled.

## 2024-07-02 NOTE — Anesthesia Preprocedure Evaluation (Signed)
 Anesthesia Evaluation  Patient identified by MRN, date of birth, ID band Patient awake    Reviewed: Allergy & Precautions, H&P , NPO status , Patient's Chart, lab work & pertinent test results, reviewed documented beta blocker date and time   History of Anesthesia Complications Negative for: history of anesthetic complications  Airway Mallampati: II  TM Distance: >3 FB Neck ROM: full    Dental  (+) Dental Advidsory Given, Teeth Intact   Pulmonary neg shortness of breath, asthma (as a child) , neg sleep apnea, neg COPD, neg recent URI, Current Smoker   Pulmonary exam normal breath sounds clear to auscultation       Cardiovascular Exercise Tolerance: Good negative cardio ROS Normal cardiovascular exam Rhythm:regular Rate:Normal     Neuro/Psych negative neurological ROS  negative psych ROS   GI/Hepatic Neg liver ROS,GERD  Medicated and Controlled,,  Endo/Other  diabetes (borderline) Hyperthyroidism Grave's disease  Renal/GU negative Renal ROS  negative genitourinary   Musculoskeletal   Abdominal   Peds  Hematology negative hematology ROS (+)   Anesthesia Other Findings Past Medical History: No date: Thyroid disease   Reproductive/Obstetrics negative OB ROS                              Anesthesia Physical Anesthesia Plan  ASA: 2  Anesthesia Plan: General   Post-op Pain Management:    Induction: Intravenous  PONV Risk Score and Plan: 1 and Ondansetron , Dexamethasone, Midazolam and Treatment may vary due to age or medical condition  Airway Management Planned: Oral ETT  Additional Equipment:   Intra-op Plan:   Post-operative Plan: Extubation in OR  Informed Consent: I have reviewed the patients History and Physical, chart, labs and discussed the procedure including the risks, benefits and alternatives for the proposed anesthesia with the patient or authorized representative who  has indicated his/her understanding and acceptance.     Dental Advisory Given  Plan Discussed with: Anesthesiologist, CRNA and Surgeon  Anesthesia Plan Comments:          Anesthesia Quick Evaluation
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
## 2024-07-02 NOTE — Anesthesia Procedure Notes (Addendum)
 Procedure Name: Intubation Date/Time: 07/02/2024 12:29 PM  Performed by: Colon Morna SQUIBB, RNPre-anesthesia Checklist: Patient identified, Emergency Drugs available, Suction available and Patient being monitored Patient Re-evaluated:Patient Re-evaluated prior to induction Oxygen Delivery Method: Circle system utilized Preoxygenation: Pre-oxygenation with 100% oxygen Induction Type: IV induction Ventilation: Mask ventilation without difficulty Laryngoscope Size: McGrath and 3 Grade View: Grade I Tube type: Oral Tube size: 7.5 mm Number of attempts: 1 Airway Equipment and Method: Stylet and Video-laryngoscopy Placement Confirmation: ETT inserted through vocal cords under direct vision, positive ETCO2 and breath sounds checked- equal and bilateral Secured at: 23 cm Tube secured with: Tape Dental Injury: Teeth and Oropharynx as per pre-operative assessment  Comments: Elective mcgrath

## 2024-07-03 ENCOUNTER — Encounter: Payer: Self-pay | Admitting: Surgery

## 2024-07-12 NOTE — Anesthesia Postprocedure Evaluation (Signed)
"   Anesthesia Post Note  Patient: Andre Strong  Procedure(s) Performed: OPEN REDUCTION INTERNAL FIXATION (ORIF) CLAVICULAR FRACTURE (Left: Shoulder)  Patient location during evaluation: PACU Anesthesia Type: General Level of consciousness: awake and alert Pain management: pain level controlled Vital Signs Assessment: post-procedure vital signs reviewed and stable Respiratory status: spontaneous breathing, nonlabored ventilation, respiratory function stable and patient connected to nasal cannula oxygen Cardiovascular status: blood pressure returned to baseline and stable Postop Assessment: no apparent nausea or vomiting Anesthetic complications: no   No notable events documented.   Last Vitals:  Vitals:   07/02/24 1517 07/02/24 1526  BP: 136/61 (!) 146/76  Pulse: 83 80  Resp: 18 16  Temp: (!) 36.2 C 37 C  SpO2: 94% 100%    Last Pain:  Vitals:   07/02/24 1526  TempSrc: Temporal                 Prentice Murphy      "

## 2024-07-31 ENCOUNTER — Encounter: Payer: Self-pay | Admitting: Surgery
